# Patient Record
Sex: Female | Born: 1960 | Race: White | Hispanic: No | Marital: Married | Smoking: Current every day smoker
Health system: Southern US, Community
[De-identification: ages and names within clinical notes are randomized; demographics above are authoritative.]

## PROBLEM LIST (undated history)

## (undated) DIAGNOSIS — M549 Dorsalgia, unspecified: Secondary | ICD-10-CM

## (undated) DIAGNOSIS — G8929 Other chronic pain: Secondary | ICD-10-CM

## (undated) DIAGNOSIS — N912 Amenorrhea, unspecified: Secondary | ICD-10-CM

## (undated) HISTORY — DX: Dorsalgia, unspecified: M54.9

## (undated) HISTORY — PX: OTHER SURGICAL HISTORY: SHX169

## (undated) HISTORY — DX: Amenorrhea, unspecified: N91.2

## (undated) HISTORY — DX: Other chronic pain: G89.29

---

## 2013-12-10 ENCOUNTER — Encounter (HOSPITAL_COMMUNITY): Payer: Self-pay | Admitting: Emergency Medicine

## 2013-12-10 ENCOUNTER — Emergency Department (HOSPITAL_COMMUNITY): Payer: No Typology Code available for payment source

## 2013-12-10 ENCOUNTER — Emergency Department (HOSPITAL_COMMUNITY)
Admission: EM | Admit: 2013-12-10 | Discharge: 2013-12-10 | Disposition: A | Discharge status: Home / Self Care | Payer: No Typology Code available for payment source | Attending: Emergency Medicine | Admitting: Emergency Medicine

## 2013-12-10 DIAGNOSIS — S3981XA Other specified injuries of abdomen, initial encounter: Secondary | ICD-10-CM | POA: Insufficient documentation

## 2013-12-10 DIAGNOSIS — S139XXA Sprain of joints and ligaments of unspecified parts of neck, initial encounter: Secondary | ICD-10-CM | POA: Diagnosis not present

## 2013-12-10 DIAGNOSIS — T07XXXA Unspecified multiple injuries, initial encounter: Secondary | ICD-10-CM

## 2013-12-10 DIAGNOSIS — Z23 Encounter for immunization: Secondary | ICD-10-CM | POA: Insufficient documentation

## 2013-12-10 DIAGNOSIS — Z7982 Long term (current) use of aspirin: Secondary | ICD-10-CM | POA: Diagnosis not present

## 2013-12-10 DIAGNOSIS — S161XXA Strain of muscle, fascia and tendon at neck level, initial encounter: Secondary | ICD-10-CM

## 2013-12-10 DIAGNOSIS — Z9889 Other specified postprocedural states: Secondary | ICD-10-CM | POA: Diagnosis not present

## 2013-12-10 DIAGNOSIS — Z79899 Other long term (current) drug therapy: Secondary | ICD-10-CM | POA: Diagnosis not present

## 2013-12-10 DIAGNOSIS — Y9389 Activity, other specified: Secondary | ICD-10-CM | POA: Diagnosis not present

## 2013-12-10 DIAGNOSIS — IMO0002 Reserved for concepts with insufficient information to code with codable children: Secondary | ICD-10-CM | POA: Diagnosis not present

## 2013-12-10 DIAGNOSIS — S0990XA Unspecified injury of head, initial encounter: Secondary | ICD-10-CM | POA: Diagnosis not present

## 2013-12-10 DIAGNOSIS — Y92410 Unspecified street and highway as the place of occurrence of the external cause: Secondary | ICD-10-CM | POA: Insufficient documentation

## 2013-12-10 DIAGNOSIS — S39012A Strain of muscle, fascia and tendon of lower back, initial encounter: Secondary | ICD-10-CM

## 2013-12-10 DIAGNOSIS — Z3202 Encounter for pregnancy test, result negative: Secondary | ICD-10-CM | POA: Diagnosis not present

## 2013-12-10 DIAGNOSIS — S0993XA Unspecified injury of face, initial encounter: Secondary | ICD-10-CM | POA: Diagnosis present

## 2013-12-10 LAB — COMPREHENSIVE METABOLIC PANEL
ALT: 23 U/L (ref 0–35)
ANION GAP: 13 (ref 5–15)
AST: 10 U/L (ref 0–37)
Albumin: 3.6 g/dL (ref 3.5–5.2)
Alkaline Phosphatase: 104 U/L (ref 39–117)
BILIRUBIN TOTAL: 0.6 mg/dL (ref 0.3–1.2)
BUN: 10 mg/dL (ref 6–23)
CALCIUM: 9.3 mg/dL (ref 8.4–10.5)
CO2: 25 mEq/L (ref 19–32)
CREATININE: 0.65 mg/dL (ref 0.50–1.10)
Chloride: 101 mEq/L (ref 96–112)
GFR calc Af Amer: >90 mL/min (ref >90)
GFR calc non Af Amer: >90 mL/min (ref >90)
Glucose, Bld: 101 mg/dL — ABNORMAL HIGH (ref 70–99)
Potassium: 4.3 mEq/L (ref 3.7–5.3)
Sodium: 139 mEq/L (ref 137–147)
Total Protein: 7.9 g/dL (ref 6.0–8.3)

## 2013-12-10 LAB — CBC WITH DIFFERENTIAL/PLATELET
BASOS PCT: 0 % (ref 0–1)
Basophils Absolute: 0 10*3/uL (ref 0.0–0.1)
EOS PCT: 1 % (ref 0–5)
Eosinophils Absolute: 0.1 10*3/uL (ref 0.0–0.7)
HEMATOCRIT: 46.4 % — AB (ref 36.0–46.0)
HEMOGLOBIN: 16.1 g/dL — AB (ref 12.0–15.0)
Lymphocytes Relative: 14 % (ref 12–46)
Lymphs Abs: 2 10*3/uL (ref 0.7–4.0)
MCH: 31.9 pg (ref 26.0–34.0)
MCHC: 34.7 g/dL (ref 30.0–36.0)
MCV: 92.1 fL (ref 78.0–100.0)
MONO ABS: 0.6 10*3/uL (ref 0.1–1.0)
MONOS PCT: 4 % (ref 3–12)
Neutro Abs: 11 10*3/uL — ABNORMAL HIGH (ref 1.7–7.7)
Neutrophils Relative %: 81 % — ABNORMAL HIGH (ref 43–77)
Platelets: 298 10*3/uL (ref 150–400)
RBC: 5.04 MIL/uL (ref 3.87–5.11)
RDW: 13.4 % (ref 11.5–15.5)
WBC: 13.7 10*3/uL — ABNORMAL HIGH (ref 4.0–10.5)

## 2013-12-10 LAB — URINALYSIS, ROUTINE W REFLEX MICROSCOPIC
Bilirubin Urine: NEGATIVE
Glucose, UA: NEGATIVE mg/dL
Hgb urine dipstick: NEGATIVE
KETONES UR: 15 mg/dL — AB
LEUKOCYTES UA: NEGATIVE
NITRITE: NEGATIVE
PH: 7.5 (ref 5.0–8.0)
Protein, ur: NEGATIVE mg/dL
Specific Gravity, Urine: 1.02 (ref 1.005–1.030)
Urobilinogen, UA: 0.2 mg/dL (ref 0.0–1.0)

## 2013-12-10 LAB — PREGNANCY, URINE: Preg Test, Ur: NEGATIVE

## 2013-12-10 MED ORDER — SODIUM CHLORIDE 0.9 % IV BOLUS (SEPSIS)
1000.0000 mL | Freq: Once | INTRAVENOUS | Status: AC
  Administered 2013-12-10: 1000 mL via INTRAVENOUS

## 2013-12-10 MED ORDER — IOHEXOL 300 MG/ML  SOLN
100.0000 mL | Freq: Once | INTRAMUSCULAR | Status: AC | PRN
  Administered 2013-12-10: 100 mL via INTRAVENOUS

## 2013-12-10 MED ORDER — TETANUS-DIPHTH-ACELL PERTUSSIS 5-2.5-18.5 LF-MCG/0.5 IM SUSP
0.5000 mL | Freq: Once | INTRAMUSCULAR | Status: AC
  Administered 2013-12-10: 0.5 mL via INTRAMUSCULAR
  Filled 2013-12-10: qty 0.5

## 2013-12-10 MED ORDER — OXYCODONE-ACETAMINOPHEN 5-325 MG PO TABS: 2.0000 | ORAL_TABLET | ORAL | Status: DC | PRN

## 2013-12-10 MED ORDER — SODIUM CHLORIDE 0.9 % IV BOLUS (SEPSIS): 500.0000 mL | Freq: Once | INTRAVENOUS | Status: DC

## 2013-12-10 MED ORDER — MORPHINE SULFATE 4 MG/ML IJ SOLN
4.0000 mg | Freq: Once | INTRAMUSCULAR | Status: AC
  Administered 2013-12-10: 4 mg via INTRAVENOUS
  Filled 2013-12-10: qty 1

## 2013-12-10 MED ORDER — CYCLOBENZAPRINE HCL 10 MG PO TABS: 10.0000 mg | ORAL_TABLET | Freq: Two times a day (BID) | ORAL | Status: DC | PRN

## 2013-12-10 MED ORDER — ONDANSETRON HCL 4 MG/2ML IJ SOLN
4.0000 mg | Freq: Once | INTRAMUSCULAR | Status: AC
  Administered 2013-12-10: 4 mg via INTRAVENOUS
  Filled 2013-12-10: qty 2

## 2013-12-10 NOTE — ED Notes (Signed)
Pt's belongings given to pt's husband, cell phone, ear rings, purse and glasses. Paper scrubs given to the pt do to glasses present on her own clothing.

## 2013-12-10 NOTE — Discharge Instructions (Signed)
Abrasion An abrasion is a cut or scrape of the skin. Abrasions do not extend through all layers of the skin and most heal within 10 days. It is important to care for your abrasion properly to prevent infection. CAUSES  Most abrasions are caused by falling on, or gliding across, the ground or other surface. When your skin rubs on something, the outer and inner layer of skin rubs off, causing an abrasion. DIAGNOSIS  Your caregiver will be able to diagnose an abrasion during a physical exam.  TREATMENT  Your treatment depends on how large and deep the abrasion is. Generally, your abrasion will be cleaned with water and a mild soap to remove any dirt or debris. An antibiotic ointment may be put over the abrasion to prevent an infection. A bandage (dressing) may be wrapped around the abrasion to keep it from getting dirty.  You may need a tetanus shot if:  You cannot remember when you had your last tetanus shot.  You have never had a tetanus shot.  The injury broke your skin. If you get a tetanus shot, your arm may swell, get red, and feel warm to the touch. This is common and not a problem. If you need a tetanus shot and you choose not to have one, there is a rare chance of getting tetanus. Sickness from tetanus can be serious.  HOME CARE INSTRUCTIONS   If a dressing was applied, change it at least once a day or as directed by your caregiver. If the bandage sticks, soak it off with warm water.   Wash the area with water and a mild soap to remove all the ointment 2 times a day. Rinse off the soap and pat the area dry with a clean towel.   Reapply any ointment as directed by your caregiver. This will help prevent infection and keep the bandage from sticking. Use gauze over the wound and under the dressing to help keep the bandage from sticking.   Change your dressing right away if it becomes wet or dirty.   Only take over-the-counter or prescription medicines for pain, discomfort, or fever as  directed by your caregiver.   Follow up with your caregiver within 24-48 hours for a wound check, or as directed. If you were not given a wound-check appointment, look closely at your abrasion for redness, swelling, or pus. These are signs of infection. SEEK IMMEDIATE MEDICAL CARE IF:   You have increasing pain in the wound.   You have redness, swelling, or tenderness around the wound.   You have pus coming from the wound.   You have a fever or persistent symptoms for more than 2-3 days.  You have a fever and your symptoms suddenly get worse.  You have a bad smell coming from the wound or dressing.  MAKE SURE YOU:   Understand these instructions.  Will watch your condition.  Will get help right away if you are not doing well or get worse. Document Released: 03/07/2005 Document Revised: 05/14/2012 Document Reviewed: 05/01/2011 [facility]Exit[facility] Patient Information 2015 [place], [place]. This information is not intended to replace advice given to you by your health care provider. Make sure you discuss any questions you have with your health care provider.  Cervical Sprain A cervical sprain is an injury in the neck in which the strong, fibrous tissues (ligaments) that connect your neck bones stretch or tear. Cervical sprains can range from mild to severe. Severe cervical sprains can cause the neck vertebrae to be unstable.  This can lead to damage of the spinal cord and can result in serious nervous system problems. The amount of time it takes for a cervical sprain to get better depends on the cause and extent of the injury. Most cervical sprains heal in 1 to 3 weeks. CAUSES  Severe cervical sprains may be caused by:   Contact sport injuries (such as from football, rugby, wrestling, hockey, auto racing, gymnastics, diving, martial arts, or boxing).   Motor vehicle collisions.   Whiplash injuries. This is an injury from a sudden forward and backward whipping movement of the head and  neck.  Falls.  Mild cervical sprains may be caused by:   Being in an awkward position, such as while cradling a telephone between your ear and shoulder.   Sitting in a chair that does not offer proper support.   Working at a poorly Marketing executivedesigned computer station.   Looking up or down for long periods of time.  SYMPTOMS   Pain, soreness, stiffness, or a burning sensation in the front, back, or sides of the neck. This discomfort may develop immediately after the injury or slowly, 24 hours or more after the injury.   Pain or tenderness directly in the middle of the back of the neck.   Shoulder or upper back pain.   Limited ability to move the neck.   Headache.   Dizziness.   Weakness, numbness, or tingling in the hands or arms.   Muscle spasms.   Difficulty swallowing or chewing.   Tenderness and swelling of the neck.  DIAGNOSIS  Most of the time your health care provider can diagnose a cervical sprain by taking your history and doing a physical exam. Your health care provider will ask about previous neck injuries and any known neck problems, such as arthritis in the neck. X-rays may be taken to find out if there are any other problems, such as with the bones of the neck. Other tests, such as a CT scan or MRI, may also be needed.  TREATMENT  Treatment depends on the severity of the cervical sprain. Mild sprains can be treated with rest, keeping the neck in place (immobilization), and pain medicines. Severe cervical sprains are immediately immobilized. Further treatment is done to help with pain, muscle spasms, and other symptoms and may include:  Medicines, such as pain relievers, numbing medicines, or muscle relaxants.   Physical therapy. This may involve stretching exercises, strengthening exercises, and posture training. Exercises and improved posture can help stabilize the neck, strengthen muscles, and help stop symptoms from returning.  HOME CARE INSTRUCTIONS    Put ice on the injured area.   Put ice in a plastic bag.   Place a towel between your skin and the bag.   Leave the ice on for 15-20 minutes, 3-4 times a day.   If your injury was severe, you may have been given a cervical collar to wear. A cervical collar is a two-piece collar designed to keep your neck from moving while it heals.  Do not remove the collar unless instructed by your health care provider.  If you have long hair, keep it outside of the collar.  Ask your health care provider before making any adjustments to your collar. Minor adjustments may be required over time to improve comfort and reduce pressure on your chin or on the back of your head.  Ifyou are allowed to remove the collar for cleaning or bathing, follow your health care provider's instructions on how to do  so safely.  Keep your collar clean by wiping it with mild soap and water and drying it completely. If the collar you have been given includes removable pads, remove them every 1-2 days and hand wash them with soap and water. Allow them to air dry. They should be completely dry before you wear them in the collar.  If you are allowed to remove the collar for cleaning and bathing, wash and dry the skin of your neck. Check your skin for irritation or sores. If you see any, tell your health care provider.  Do not drive while wearing the collar.   Only take over-the-counter or prescription medicines for pain, discomfort, or fever as directed by your health care provider.   Keep all follow-up appointments as directed by your health care provider.   Keep all physical therapy appointments as directed by your health care provider.   Make any needed adjustments to your workstation to promote good posture.   Avoid positions and activities that make your symptoms worse.   Warm up and stretch before being active to help prevent problems.  SEEK MEDICAL CARE IF:   Your pain is not controlled with  medicine.   You are unable to decrease your pain medicine over time as planned.   Your activity level is not improving as expected.  SEEK IMMEDIATE MEDICAL CARE IF:   You develop any bleeding.  You develop stomach upset.  You have signs of an allergic reaction to your medicine.   Your symptoms get worse.   You develop new, unexplained symptoms.   You have numbness, tingling, weakness, or paralysis in any part of your body.  MAKE SURE YOU:   Understand these instructions.  Will watch your condition.  Will get help right away if you are not doing well or get worse. Document Released: 03/25/2007 Document Revised: 06/02/2013 Document Reviewed: 12/03/2012 [facility] LLPExitCare Patient Information 2015 [place]ExitCare, [place]LL[place]. This information is not intended to replace advice given to you by your health care provider. Make sure you discuss any questions you have with your health care provider.
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
## 2014-03-23 ENCOUNTER — Other Ambulatory Visit: Payer: Self-pay | Admitting: Chiropractor

## 2014-03-23 DIAGNOSIS — M545 Low back pain, unspecified: Secondary | ICD-10-CM

## 2014-03-27 ENCOUNTER — Other Ambulatory Visit: Payer: BC Managed Care – PPO

## 2014-04-02 ENCOUNTER — Ambulatory Visit
Admission: RE | Admit: 2014-04-02 | Discharge: 2014-04-02 | Disposition: A | Discharge status: Home / Self Care | Payer: BC Managed Care – PPO | Source: Ambulatory Visit | Attending: Chiropractor | Admitting: Chiropractor

## 2014-04-02 DIAGNOSIS — M545 Low back pain, unspecified: Secondary | ICD-10-CM

## 2014-06-17 ENCOUNTER — Encounter (INDEPENDENT_AMBULATORY_CARE_PROVIDER_SITE_OTHER): Payer: Self-pay | Admitting: Diagnostic Neuroimaging

## 2014-06-17 ENCOUNTER — Ambulatory Visit (INDEPENDENT_AMBULATORY_CARE_PROVIDER_SITE_OTHER): Payer: 59 | Admitting: Diagnostic Neuroimaging

## 2014-06-17 DIAGNOSIS — R29898 Other symptoms and signs involving the musculoskeletal system: Secondary | ICD-10-CM

## 2014-06-17 DIAGNOSIS — Z0289 Encounter for other administrative examinations: Secondary | ICD-10-CM
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
## 2014-06-30 NOTE — Progress Notes (Signed)
This encounter was created in error - please disregard.

## 2014-07-06 ENCOUNTER — Other Ambulatory Visit: Payer: Self-pay | Admitting: Physical Medicine and Rehabilitation

## 2014-07-06 DIAGNOSIS — M542 Cervicalgia: Secondary | ICD-10-CM

## 2014-07-14 ENCOUNTER — Ambulatory Visit
Admission: RE | Admit: 2014-07-14 | Discharge: 2014-07-14 | Disposition: A | Discharge status: Home / Self Care | Payer: 59 | Source: Ambulatory Visit | Attending: Physical Medicine and Rehabilitation | Admitting: Physical Medicine and Rehabilitation

## 2014-07-14 DIAGNOSIS — M542 Cervicalgia: Secondary | ICD-10-CM

## 2015-05-26 ENCOUNTER — Encounter: Payer: Self-pay | Admitting: Obstetrics and Gynecology

## 2015-05-26 ENCOUNTER — Ambulatory Visit (INDEPENDENT_AMBULATORY_CARE_PROVIDER_SITE_OTHER): Payer: 59 | Admitting: Obstetrics and Gynecology

## 2015-05-26 VITALS — BP 122/80 | HR 88 | Resp 16 | Ht 63.0 in | Wt 181.0 lb

## 2015-05-26 DIAGNOSIS — Z01419 Encounter for gynecological examination (general) (routine) without abnormal findings: Secondary | ICD-10-CM

## 2015-05-26 DIAGNOSIS — Z1211 Encounter for screening for malignant neoplasm of colon: Secondary | ICD-10-CM

## 2015-05-26 DIAGNOSIS — Z124 Encounter for screening for malignant neoplasm of cervix: Secondary | ICD-10-CM

## 2015-05-26 DIAGNOSIS — Z0000 Encounter for general adult medical examination without abnormal findings: Secondary | ICD-10-CM

## 2015-05-26 DIAGNOSIS — R5382 Chronic fatigue, unspecified: Secondary | ICD-10-CM

## 2015-05-26 LAB — COMPREHENSIVE METABOLIC PANEL
ALT: 36 U/L — ABNORMAL HIGH (ref 6–29)
AST: 13 U/L (ref 10–35)
Albumin: 3.9 g/dL (ref 3.6–5.1)
Alkaline Phosphatase: 97 U/L (ref 33–130)
BUN: 11 mg/dL (ref 7–25)
CHLORIDE: 101 mmol/L (ref 98–110)
CO2: 28 mmol/L (ref 20–31)
Calcium: 9.3 mg/dL (ref 8.6–10.4)
Creat: 0.62 mg/dL (ref 0.50–1.05)
Glucose, Bld: 81 mg/dL (ref 65–99)
POTASSIUM: 4 mmol/L (ref 3.5–5.3)
Sodium: 140 mmol/L (ref 135–146)
Total Bilirubin: 0.6 mg/dL (ref 0.2–1.2)
Total Protein: 7.3 g/dL (ref 6.1–8.1)

## 2015-05-26 LAB — CBC
HCT: 46.1 % — ABNORMAL HIGH (ref 36.0–46.0)
Hemoglobin: 15.8 g/dL — ABNORMAL HIGH (ref 12.0–15.0)
MCH: 30.8 pg (ref 26.0–34.0)
MCHC: 34.3 g/dL (ref 30.0–36.0)
MCV: 89.9 fL (ref 78.0–100.0)
MPV: 9.1 fL (ref 8.6–12.4)
Platelets: 337 10*3/uL (ref 150–400)
RBC: 5.13 MIL/uL — ABNORMAL HIGH (ref 3.87–5.11)
RDW: 14 % (ref 11.5–15.5)
WBC: 10.2 10*3/uL (ref 4.0–10.5)

## 2015-05-26 LAB — TSH: TSH: 3.249 u[IU]/mL (ref 0.350–4.500)

## 2015-05-26 NOTE — Patient Instructions (Signed)
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
## 2015-05-27 LAB — VITAMIN D 25 HYDROXY (VIT D DEFICIENCY, FRACTURES): Vit D, 25-Hydroxy: 24 ng/mL — ABNORMAL LOW (ref 30–100)
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
## 2015-05-30 LAB — IPS PAP TEST WITH HPV

## 2015-06-20 ENCOUNTER — Telehealth: Payer: Self-pay | Admitting: *Deleted

## 2015-06-20 NOTE — Telephone Encounter (Signed)
LMTC in regards to lab results -eh 

## 2015-06-20 NOTE — Telephone Encounter (Signed)
Patient is aware of lab results. I have also sent a copy to her PCP. -eh

## 2015-06-20 NOTE — Telephone Encounter (Signed)
Patient returning call.

## 2015-06-20 NOTE — Telephone Encounter (Signed)
-----   Message from Romualdo BolkJill Evelyn Jertson, MD sent at 05/31/2015  3:58 PM EST ----- 02 Please inform the patient that her vit D level is low. She should increase her vit d intake by 1,000 IU a day. Please also inform her that her ALT is very slightly elevated (liver function test), she should f/u with her primary (please send a copy of her labs). Her Hgb is elevated, this goes along with smoking.

## 2015-09-29 NOTE — Addendum Note (Signed)
Addended by: Luisa DagoPHILLIPS, Wiley Magan C on: 09/29/2015 12:27 PM   Modules accepted: Orders

## 2015-11-01 ENCOUNTER — Telehealth: Payer: Self-pay | Admitting: *Deleted

## 2015-11-01 NOTE — Telephone Encounter (Signed)
-----   Message from Romualdo BolkJill Evelyn Jertson, MD sent at 10/31/2015  6:01 PM EDT ----- Can you check with the patient on her IFOB? She had declined colonoscopy, she should at minimum do her IFOB. Thanks,  Noreene LarssonJill ----- Message -----    From: SYSTEM    Sent: 10/18/2015  12:06 AM      To: Romualdo BolkJill Evelyn Jertson, MD

## 2015-11-01 NOTE — Telephone Encounter (Signed)
11-01-15 LMTC -eh 

## 2015-11-03 NOTE — Telephone Encounter (Signed)
Left another message to call -eh 

## 2015-11-15 ENCOUNTER — Encounter: Payer: Self-pay | Admitting: *Deleted

## 2015-11-15 NOTE — Telephone Encounter (Signed)
Letter sent regarding IFOB since patient has not returned call -eh

## 2015-11-24 ENCOUNTER — Telehealth: Payer: Self-pay | Admitting: Obstetrics and Gynecology

## 2015-11-24 NOTE — Telephone Encounter (Signed)
I spoke with patient and encouraged her to complete the Ifob card. She said she would try to find it and get it done. Patient was advised that if she is unable to locate her kit she can come by the office and pick up another one-eh

## 2015-11-24 NOTE — Telephone Encounter (Signed)
[place]. [place].
